# Patient Record
Sex: Female | Born: 1950 | Race: White | Hispanic: No | Marital: Single | Smoking: Current every day smoker
Health system: Southern US, Community
[De-identification: ages and names within clinical notes are randomized; demographics above are authoritative.]

## PROBLEM LIST (undated history)

## (undated) DIAGNOSIS — Z9889 Other specified postprocedural states: Secondary | ICD-10-CM

## (undated) DIAGNOSIS — J309 Allergic rhinitis, unspecified: Secondary | ICD-10-CM

## (undated) HISTORY — PX: TONSILLECTOMY: SUR1361

## (undated) HISTORY — PX: FRACTURE SURGERY: SHX138

---

## 2013-11-02 ENCOUNTER — Emergency Department
Admission: EM | Admit: 2013-11-02 | Discharge: 2013-11-02 | Disposition: A | Discharge status: Home / Self Care | Payer: BC Managed Care – PPO | Source: Home / Self Care | Attending: Emergency Medicine | Admitting: Emergency Medicine

## 2013-11-02 ENCOUNTER — Emergency Department (INDEPENDENT_AMBULATORY_CARE_PROVIDER_SITE_OTHER): Payer: BC Managed Care – PPO

## 2013-11-02 ENCOUNTER — Encounter: Payer: Self-pay | Admitting: Emergency Medicine

## 2013-11-02 DIAGNOSIS — M6580 Other synovitis and tenosynovitis, unspecified site: Secondary | ICD-10-CM

## 2013-11-02 DIAGNOSIS — M25529 Pain in unspecified elbow: Secondary | ICD-10-CM

## 2013-11-02 DIAGNOSIS — M25522 Pain in left elbow: Secondary | ICD-10-CM

## 2013-11-02 DIAGNOSIS — M778 Other enthesopathies, not elsewhere classified: Secondary | ICD-10-CM

## 2013-11-02 HISTORY — DX: Other specified postprocedural states: Z98.890

## 2013-11-02 HISTORY — DX: Allergic rhinitis, unspecified: J30.9

## 2013-11-02 MED ORDER — MELOXICAM 7.5 MG PO TABS: 7.5000 mg | ORAL_TABLET | Freq: Every day | ORAL | Status: DC

## 2013-11-02 NOTE — ED Provider Notes (Signed)
CSN: [id number]     Arrival date & time 11/02/13  1221 History   First MD Initiated Contact with Patient 11/02/13 1249     Chief Complaint  Patient presents with  . Elbow Pain   (Consider location/radiation/quality/duration/timing/severity/associated sxs/prior Treatment) HPI Reports  left elbow pain , progressively worse x3 months. She feels it's related to repetitive motions left arm.  She states that she may have bumped the left elbow one or 2 weeks ago, and she requests x-ray to rule out fracture.  Has had this elbow problem in past, she can't remember exactly when, and was treated with "injection" that helped. Currently ,No PCP or sports medicine provider. Left elbow pain is lateral left elbow, sharp and sore at times. Pain 6/10 at rest, 8/10 with movement. Has tried a pain patch without significant relief.  Past Medical History  Diagnosis Date  . Multiple respiratory allergies   . H/O neck surgery    Past Surgical History  Procedure Laterality Date  . Fracture surgery Left     ankle   Family History  Problem Relation Age of Onset  . Heart failure Mother   . Cancer Mother   . Heart failure Father   . Heart failure Brother    History  Substance Use Topics  . Smoking status: Current Every Day Smoker -- 1.50 packs/day  . Smokeless tobacco: Not on file  . Alcohol Use: No   OB History   Grav Para Term Preterm Abortions TAB SAB Ect Mult Living                 Review of Systems  All other systems reviewed and are negative.    Allergies  Review of patient's allergies indicates no known allergies.  Home Medications   Current Outpatient Rx  Name  Route  Sig  Dispense  Refill  . aspirin 325 MG tablet   Oral   Take 325 mg by mouth every 4 (four) hours as needed.         . cetirizine (ZYRTEC) 10 MG tablet   Oral   Take 10 mg by mouth daily.         . meloxicam (MOBIC) 7.5 MG tablet   Oral   Take 1 tablet (7.5 mg total) by mouth daily. For pain. If needed,  may increase to 2 tablets by mouth daily for pain.   30 tablet   0    BP 142/85  Pulse 92  Temp(Src) 98 F (36.7 C) (Oral)  Resp 16  Ht 5\' 1"  (1.549 m)  Wt 109 lb (49.442 kg)  BMI 20.61 kg/m2  SpO2 98% Physical Exam  Nursing note and vitals reviewed. Constitutional: She is oriented to person, place, and time. She appears well-developed and well-nourished. No distress.  HENT:  Head: Normocephalic and atraumatic.  Eyes: Conjunctivae and EOM are normal. Pupils are equal, round, and reactive to light. No scleral icterus.  Neck: Normal range of motion.  Cardiovascular: Normal rate.   Pulmonary/Chest: Effort normal.  Abdominal: She exhibits no distension.  Musculoskeletal:       Left elbow: She exhibits decreased range of motion and swelling (minimal, lateral epicondyles). She exhibits no deformity and no laceration. Tenderness found. Lateral epicondyle tenderness noted. No radial head and no olecranon process tenderness noted.  The left lateral elbow pain is exacerbated on hand grip and supination, but strength intact.  No skin changes left upper extremity  Neurovascular distally intact  Neurological: She is alert and oriented to person,  place, and time. She has normal strength. No sensory deficit.  Skin: Skin is warm. No rash noted.  Psychiatric: She has a normal mood and affect.    ED Course  Procedures (including critical care time) Labs Review Labs Reviewed - No data to display Imaging Review Dg Elbow Complete Left  11/02/2013   CLINICAL DATA:  Left elbow pain x2 months  EXAM: LEFT ELBOW - COMPLETE 3+ VIEW  COMPARISON:  None.  FINDINGS: No fracture or dislocation is seen.  The joint spaces are preserved.  No displaced elbow joint fat pads to suggest an elbow joint effusion.  The visualized soft tissues are unremarkable.  IMPRESSION: No acute osseous abnormality is seen.   Electronically Signed   By: Charline Bills M.D.   On: 11/02/2013 14:25    EKG Interpretation     Date/Time:    Ventricular Rate:    PR Interval:    QRS Duration:   QT Interval:    QTC Calculation:   R Axis:     Text Interpretation:              MDM   1. Tendinitis of left elbow   2. Elbow pain, left    X-ray left elbow negative. No fracture. She likely has tendinitis left elbow. Also, by her history, contusion left elbow. Risks, benefits, alternatives discussed Ace bandage applied. Avoid repetitive motions for the next several days. Mobic 7.5 mg daily, may increase to 15 mg daily if needed for pain. We arranged for followup appointment with sports medicine specialist, Dr. Benjamin Stain next week . Precautions discussed. Red flags discussed. Questions invited and answered. Patient voiced understanding and agreement.    Lajean Manes, MD 11/02/13 [phone]

## 2013-11-02 NOTE — ED Notes (Signed)
Reports chronic left elbow pain related to job. Has had this problem in past and was treated with "injection" that helped. No PCP or sports medicine provider.

## 2013-11-05 ENCOUNTER — Ambulatory Visit (INDEPENDENT_AMBULATORY_CARE_PROVIDER_SITE_OTHER): Payer: BC Managed Care – PPO | Admitting: Sports Medicine

## 2013-11-05 ENCOUNTER — Encounter: Payer: Self-pay | Admitting: Sports Medicine

## 2013-11-05 VITALS — BP 127/62 | HR 77 | Wt 115.0 lb

## 2013-11-05 DIAGNOSIS — M7710 Lateral epicondylitis, unspecified elbow: Secondary | ICD-10-CM

## 2013-11-05 DIAGNOSIS — M7712 Lateral epicondylitis, left elbow: Secondary | ICD-10-CM

## 2013-11-05 MED ORDER — MELOXICAM 15 MG PO TABS: ORAL_TABLET | ORAL | Status: DC

## 2013-11-05 MED ORDER — HYDROCODONE-ACETAMINOPHEN 5-325 MG PO TABS: 1.0000 | ORAL_TABLET | Freq: Three times a day (TID) | ORAL | Status: AC | PRN

## 2013-11-05 NOTE — Assessment & Plan Note (Signed)
Ultrasound-guided percutaneous tenotomy as above. Strap with compressive dressing. Hydrocodone for post procedural pain, continue 15 mg of Mobic. Return to see me in 4 weeks. Home rehabilitation. Unfortunately she's not able to do any light duty.

## 2013-11-05 NOTE — Progress Notes (Signed)
   Subjective:    I'm seeing this patient as a consultation for:   Dr. 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 For the past 2 months she's had pain which he localizes over the lateral epicondyle worse with essentially any wrist movement. She's taken some over-the-counter analgesics, and went to urgent care recently, was prescribed Mobic 7.5 mg. She does not endorse any improvement in her pain. She has also tried compression wraps and elbow pads. Symptoms are moderate, persistent, neck pain, numbness or tingling traveling down the arm.  Past medical history, Surgical history, Family history not pertinant except as noted below, Social history, Allergies, and medications have been entered into the medical record, reviewed, and no changes needed.   Review of Systems: No headache, visual changes, nausea, vomiting, diarrhea, constipation, dizziness, abdominal pain, skin rash, fevers, chills, night sweats, weight loss, swollen lymph nodes, body aches, joint swelling, muscle aches, chest pain, shortness of breath, mood changes, visual or auditory hallucinations.   Objective:   General: Well Developed, well nourished, and in no acute distress.  Neuro/Psych: Alert and oriented x3, extra-ocular muscles intact, able to move all 4 extremities, sensation grossly intact. Skin: Warm and dry, no rashes noted.  Respiratory: Not using accessory muscles, speaking in full sentences, trachea midline.  Cardiovascular: Pulses palpable, no extremity edema. Abdomen: Does not appear distended. Left Elbow: Unremarkable to inspection. Range of motion full pronation, supination, flexion, extension. Strength is full to all of the above directions Stable to varus, valgus stress. Negative moving valgus stress test. Exquisitely tender to palpation of the common extensor tendon origin with  reproduction of pain with resisted extension of the middle finger. Ulnar nerve does not sublux. Negative cubital tunnel Tinel's.  Procedure: Real-time Ultrasound Guided injection/percutaneous tenotomy of the left common extensor tendon. Device: GE Logiq E  Verbal informed consent obtained.  Time-out conducted.  Noted no overlying erythema, induration, or other signs of local infection.  Skin prepped in a sterile fashion.  Local anesthesia: Topical Ethyl chloride.  With sterile technique and under real time ultrasound guidance:  Needle was advanced to the common extensor tendon, noted multiple calcifications in the tendons substance itself. I made approximately 50 passes with the needle injecting a total of 1 cc Kenalog 40, 2 cc lidocaine. Completed without difficulty  Pain immediately resolved suggesting accurate placement of the medication.  Advised to call if fevers/chills, erythema, induration, drainage, or persistent bleeding.  Images permanently stored and available for review in the ultrasound unit.  Impression: Technically successful ultrasound guided injection.  The elbow was then strapped with compressive dressing.  Impression and Recommendations:   This case required medical decision making of moderate complexity.

## 2013-12-07 ENCOUNTER — Ambulatory Visit: Payer: BC Managed Care – PPO | Admitting: Family Medicine

## 2013-12-14 ENCOUNTER — Ambulatory Visit: Payer: BC Managed Care – PPO | Admitting: Family Medicine

## 2013-12-14 ENCOUNTER — Ambulatory Visit (INDEPENDENT_AMBULATORY_CARE_PROVIDER_SITE_OTHER): Payer: BC Managed Care – PPO | Admitting: Sports Medicine

## 2013-12-14 ENCOUNTER — Encounter: Payer: Self-pay | Admitting: Sports Medicine

## 2013-12-14 VITALS — BP 130/66 | HR 82 | Ht 61.0 in | Wt 110.0 lb

## 2013-12-14 DIAGNOSIS — M7712 Lateral epicondylitis, left elbow: Secondary | ICD-10-CM

## 2013-12-14 DIAGNOSIS — M7710 Lateral epicondylitis, unspecified elbow: Secondary | ICD-10-CM

## 2013-12-14 NOTE — Assessment & Plan Note (Signed)
Pain resolved after percutaneous tenotomy the last visit. Return as needed.

## 2013-12-14 NOTE — Progress Notes (Signed)
  Subjective:    CC: Followup  HPI: Left lateral epicondylitis: Ultrasound-guided percutaneous tenotomy performed approximately a month and a half ago, she returns pain-free.  Past medical history, Surgical history, Family history not pertinant except as noted below, Social history, Allergies, and medications have been entered into the medical record, reviewed, and no changes needed.   Review of Systems: No fevers, chills, night sweats, weight loss, chest pain, or shortness of breath.   Objective:    General: Well Developed, well nourished, and in no acute distress.  Neuro: Alert and oriented x3, extra-ocular muscles intact, sensation grossly intact.  HEENT: Normocephalic, atraumatic, pupils equal round reactive to light, neck supple, no masses, no lymphadenopathy, thyroid nonpalpable.  Skin: Warm and dry, no rashes. Cardiac: Regular rate and rhythm, no murmurs rubs or gallops, no lower extremity edema.  Respiratory: Clear to auscultation bilaterally. Not using accessory muscles, speaking in full sentences. Left Elbow: Unremarkable to inspection. Range of motion full pronation, supination, flexion, extension. Strength is full to all of the above directions Stable to varus, valgus stress. Negative moving valgus stress test. No discrete areas of tenderness to palpation. Ulnar nerve does not sublux. Negative cubital tunnel Tinel's.  Impression and Recommendations:

## 2014-06-17 ENCOUNTER — Other Ambulatory Visit: Payer: Self-pay | Admitting: Sports Medicine

## 2014-06-17 DIAGNOSIS — M7712 Lateral epicondylitis, left elbow: Secondary | ICD-10-CM

## 2014-06-17 MED ORDER — MELOXICAM 15 MG PO TABS: 15.0000 mg | ORAL_TABLET | Freq: Every day | ORAL | Status: AC

## 2014-12-01 IMAGING — CR DG ELBOW COMPLETE 3+V*L*
2 series · 2 of 2 positions shown · non-contrast
Comparison: None.

CLINICAL DATA: Left elbow pain x2 months

EXAM:
LEFT ELBOW - COMPLETE 3+ VIEW

[view not recorded (1 of 2)]
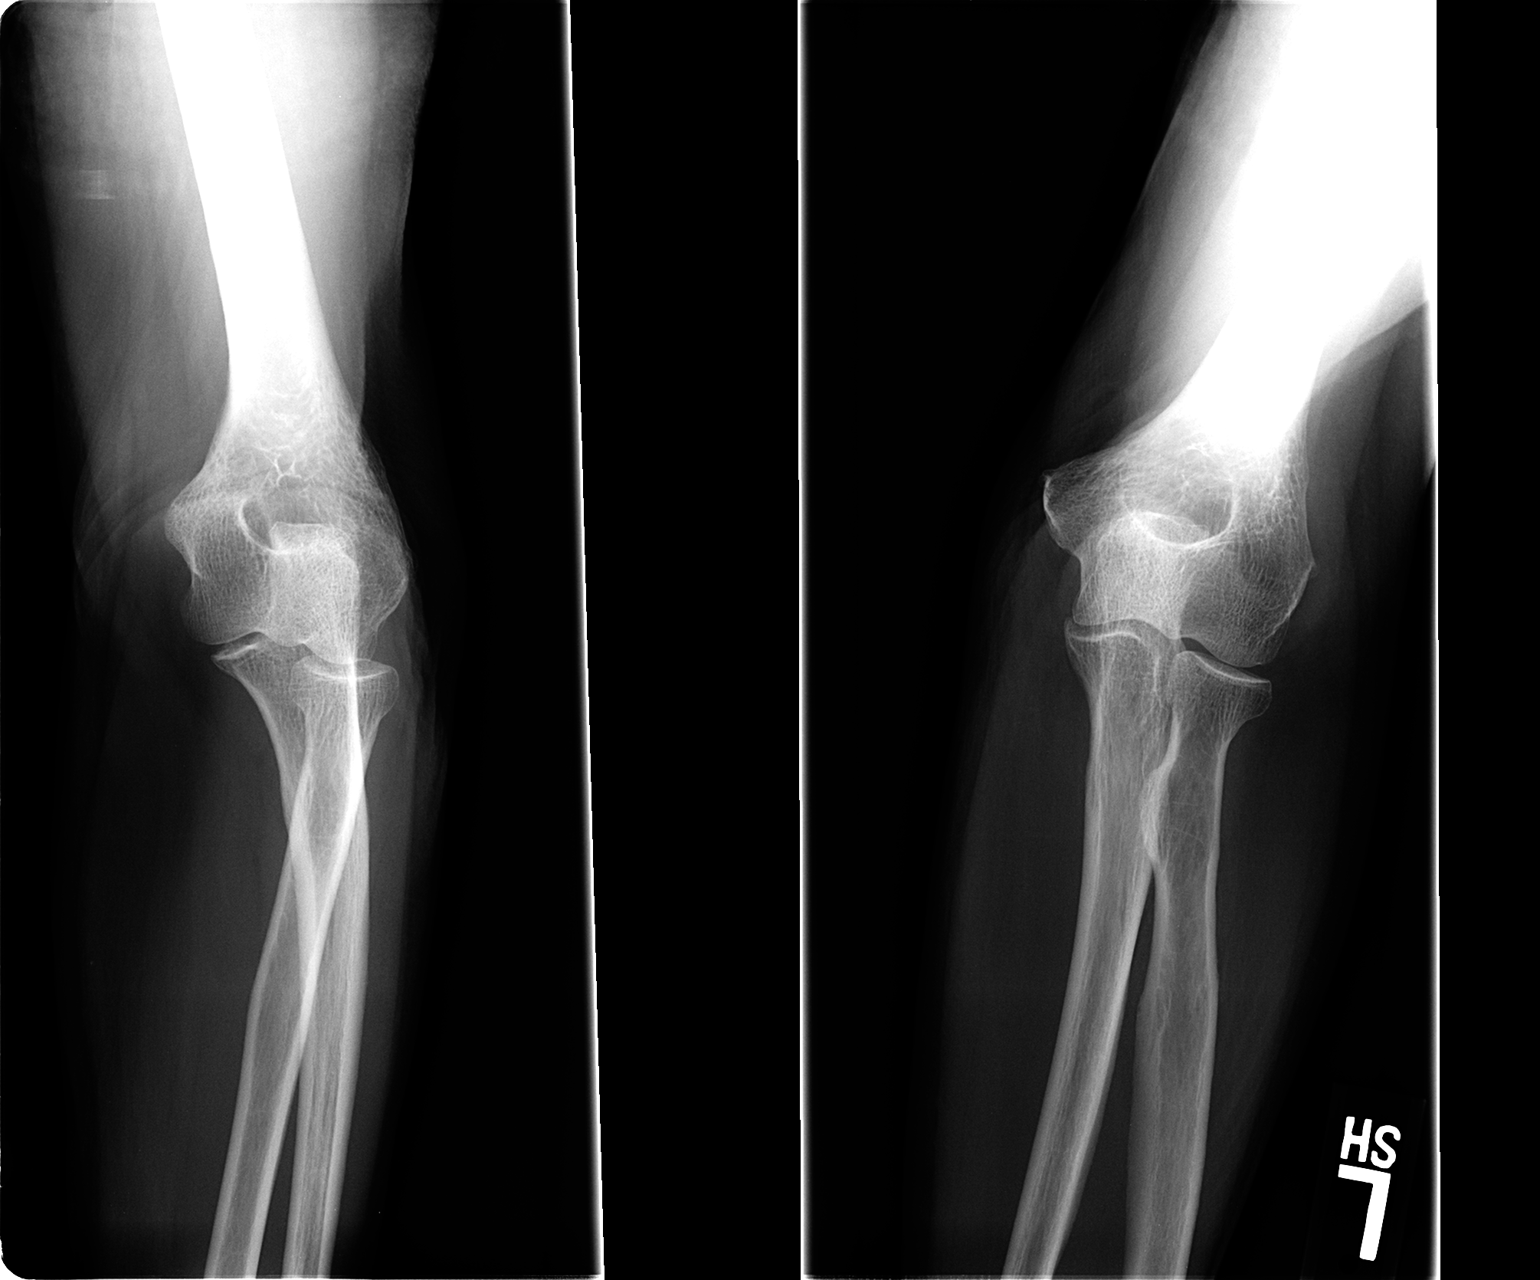

[view not recorded (2 of 2)]
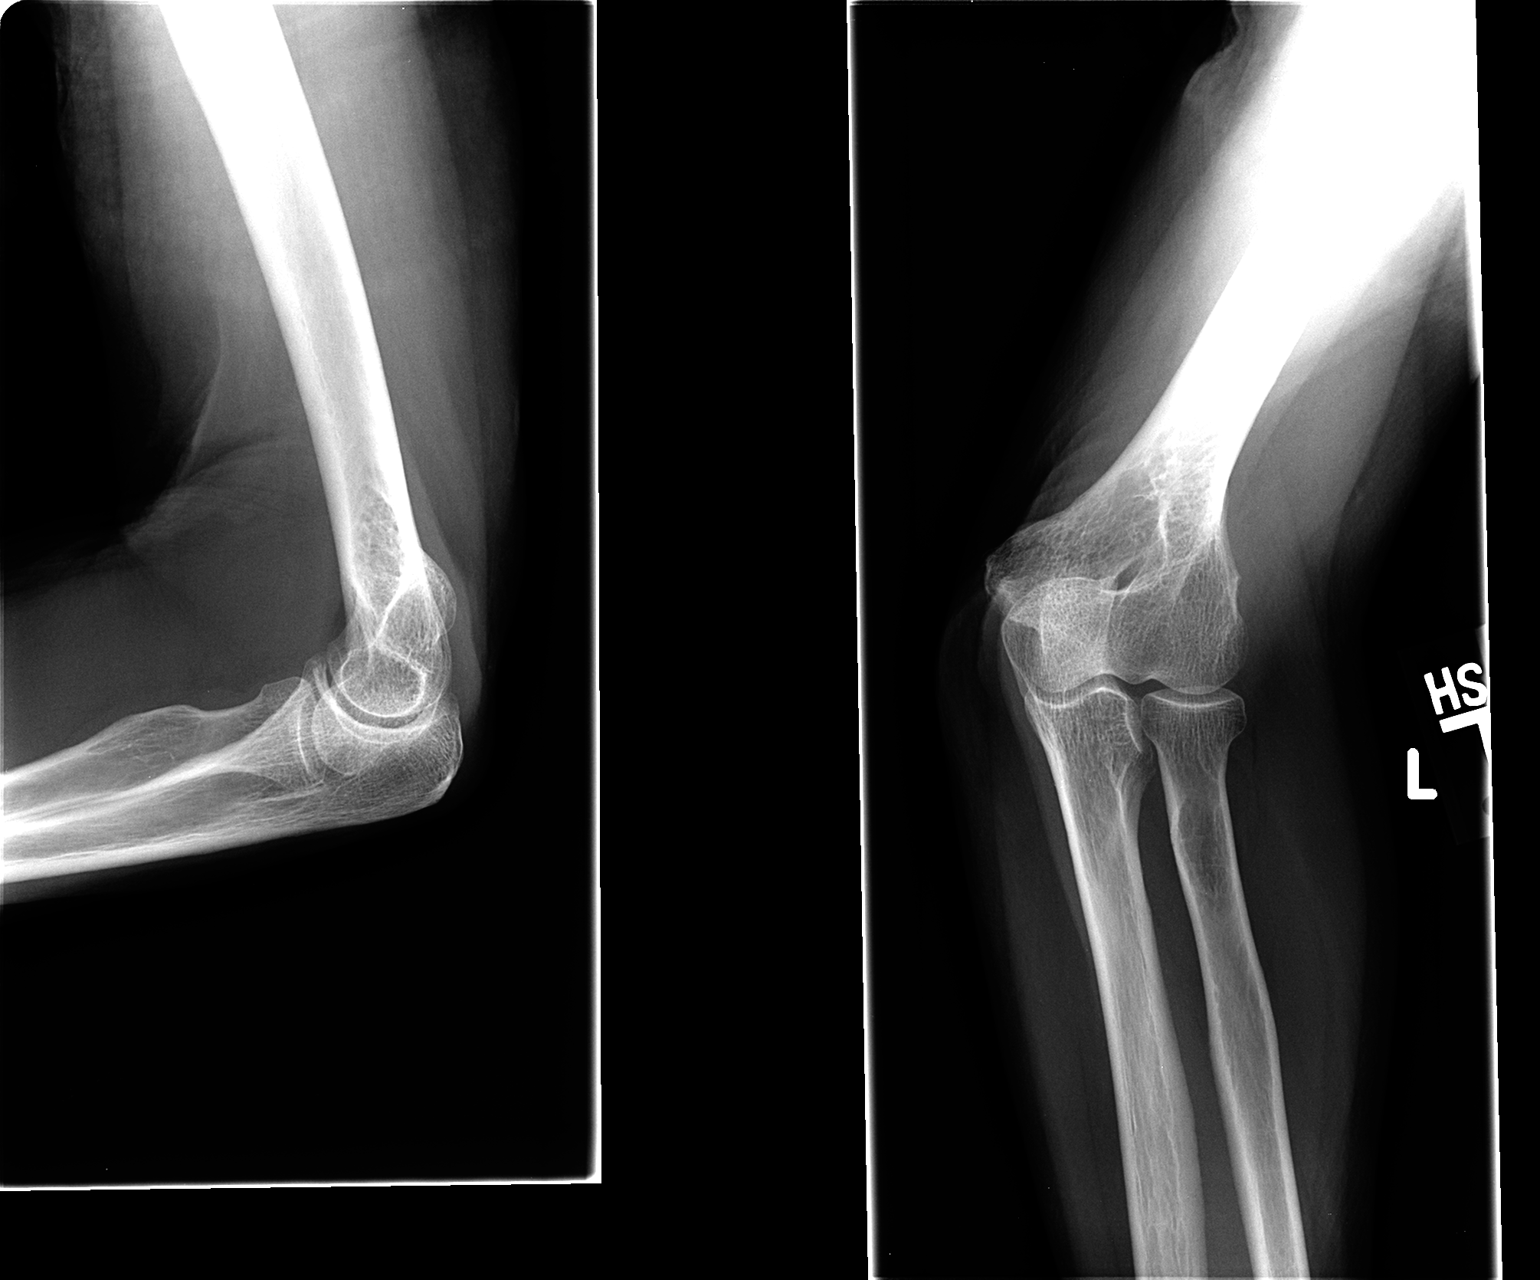

[2 of 2 positions shown; findings below may reference images not displayed]

FINDINGS: No fracture or dislocation is seen.

The joint spaces are preserved.

No displaced elbow joint fat pads to suggest an elbow joint
effusion.

The visualized soft tissues are unremarkable.
IMPRESSION: No acute osseous abnormality is seen.
